# Patient Record
Sex: Female | Born: 1950 | Race: White | Hispanic: No | Marital: Married | Smoking: Never smoker
Health system: Southern US, Community
[De-identification: ages and names within clinical notes are randomized; demographics above are authoritative.]

## PROBLEM LIST (undated history)

## (undated) DIAGNOSIS — M459 Ankylosing spondylitis of unspecified sites in spine: Secondary | ICD-10-CM

## (undated) DIAGNOSIS — G8929 Other chronic pain: Secondary | ICD-10-CM

## (undated) DIAGNOSIS — M797 Fibromyalgia: Secondary | ICD-10-CM

## (undated) DIAGNOSIS — E039 Hypothyroidism, unspecified: Secondary | ICD-10-CM

## (undated) HISTORY — PX: BREAST EXCISIONAL BIOPSY: SUR124

---

## 2020-01-26 ENCOUNTER — Other Ambulatory Visit: Payer: Self-pay | Admitting: Internal Medicine

## 2020-01-26 DIAGNOSIS — Z1231 Encounter for screening mammogram for malignant neoplasm of breast: Secondary | ICD-10-CM

## 2020-02-16 ENCOUNTER — Other Ambulatory Visit: Payer: Self-pay

## 2020-02-16 ENCOUNTER — Ambulatory Visit
Admission: RE | Admit: 2020-02-16 | Discharge: 2020-02-16 | Disposition: A | Discharge status: Home / Self Care | Payer: Medicare Other | Source: Ambulatory Visit | Attending: Internal Medicine | Admitting: Internal Medicine

## 2020-02-16 DIAGNOSIS — Z1231 Encounter for screening mammogram for malignant neoplasm of breast: Secondary | ICD-10-CM | POA: Diagnosis not present

## 2020-10-28 DIAGNOSIS — U071 COVID-19: Secondary | ICD-10-CM

## 2020-10-28 HISTORY — DX: COVID-19: U07.1

## 2020-11-13 ENCOUNTER — Encounter: Payer: Self-pay | Admitting: Emergency Medicine

## 2020-11-13 ENCOUNTER — Other Ambulatory Visit: Payer: Self-pay

## 2020-11-13 ENCOUNTER — Emergency Department
Admission: EM | Admit: 2020-11-13 | Discharge: 2020-11-13 | Disposition: A | Discharge status: Home / Self Care | Payer: Medicare Other | Attending: Emergency Medicine | Admitting: Emergency Medicine

## 2020-11-13 ENCOUNTER — Emergency Department: Payer: Medicare Other

## 2020-11-13 DIAGNOSIS — R031 Nonspecific low blood-pressure reading: Secondary | ICD-10-CM | POA: Diagnosis present

## 2020-11-13 DIAGNOSIS — E039 Hypothyroidism, unspecified: Secondary | ICD-10-CM | POA: Diagnosis not present

## 2020-11-13 DIAGNOSIS — R55 Syncope and collapse: Secondary | ICD-10-CM | POA: Diagnosis not present

## 2020-11-13 DIAGNOSIS — Z8616 Personal history of COVID-19: Secondary | ICD-10-CM | POA: Insufficient documentation

## 2020-11-13 HISTORY — DX: Fibromyalgia: M79.7

## 2020-11-13 HISTORY — DX: Other chronic pain: G89.29

## 2020-11-13 HISTORY — DX: Hypothyroidism, unspecified: E03.9

## 2020-11-13 LAB — TROPONIN I (HIGH SENSITIVITY)
Troponin I (High Sensitivity): 4 ng/L (ref <18)
Troponin I (High Sensitivity): 4 ng/L (ref <18)

## 2020-11-13 LAB — CBC WITH DIFFERENTIAL/PLATELET
Abs Immature Granulocytes: 0.03 10*3/uL (ref 0.00–0.07)
Basophils Absolute: 0.1 10*3/uL (ref 0.0–0.1)
Basophils Relative: 1 %
Eosinophils Absolute: 0.6 10*3/uL — ABNORMAL HIGH (ref 0.0–0.5)
Eosinophils Relative: 10 %
HCT: 31.8 % — ABNORMAL LOW (ref 36.0–46.0)
Hemoglobin: 10.4 g/dL — ABNORMAL LOW (ref 12.0–15.0)
Immature Granulocytes: 1 %
Lymphocytes Relative: 24 %
Lymphs Abs: 1.4 10*3/uL (ref 0.7–4.0)
MCH: 27.4 pg (ref 26.0–34.0)
MCHC: 32.7 g/dL (ref 30.0–36.0)
MCV: 83.7 fL (ref 80.0–100.0)
Monocytes Absolute: 0.6 10*3/uL (ref 0.1–1.0)
Monocytes Relative: 10 %
Neutro Abs: 3.2 10*3/uL (ref 1.7–7.7)
Neutrophils Relative %: 54 %
Platelets: 227 10*3/uL (ref 150–400)
RBC: 3.8 MIL/uL — ABNORMAL LOW (ref 3.87–5.11)
RDW: 13.8 % (ref 11.5–15.5)
WBC: 5.8 10*3/uL (ref 4.0–10.5)
nRBC: 0 % (ref 0.0–0.2)

## 2020-11-13 LAB — PROTIME-INR
INR: 1 (ref 0.8–1.2)
Prothrombin Time: 12.6 seconds (ref 11.4–15.2)

## 2020-11-13 LAB — COMPREHENSIVE METABOLIC PANEL
ALT: 14 U/L (ref 0–44)
AST: 26 U/L (ref 15–41)
Albumin: 3.6 g/dL (ref 3.5–5.0)
Alkaline Phosphatase: 44 U/L (ref 38–126)
Anion gap: 10 (ref 5–15)
BUN: 25 mg/dL — ABNORMAL HIGH (ref 8–23)
CO2: 24 mmol/L (ref 22–32)
Calcium: 8.8 mg/dL — ABNORMAL LOW (ref 8.9–10.3)
Chloride: 101 mmol/L (ref 98–111)
Creatinine, Ser: 0.94 mg/dL (ref 0.44–1.00)
GFR, Estimated: >60 mL/min (ref >60)
Glucose, Bld: 135 mg/dL — ABNORMAL HIGH (ref 70–99)
Potassium: 3.3 mmol/L — ABNORMAL LOW (ref 3.5–5.1)
Sodium: 135 mmol/L (ref 135–145)
Total Bilirubin: 0.7 mg/dL (ref 0.3–1.2)
Total Protein: 6.6 g/dL (ref 6.5–8.1)

## 2020-11-13 LAB — URINALYSIS, COMPLETE (UACMP) WITH MICROSCOPIC
Bilirubin Urine: NEGATIVE
Glucose, UA: NEGATIVE mg/dL
Hgb urine dipstick: NEGATIVE
Ketones, ur: NEGATIVE mg/dL
Nitrite: NEGATIVE
Protein, ur: NEGATIVE mg/dL
Specific Gravity, Urine: 1.01 (ref 1.005–1.030)
pH: 9 — ABNORMAL HIGH (ref 5.0–8.0)

## 2020-11-13 LAB — T4, FREE: Free T4: 1 ng/dL (ref 0.61–1.12)

## 2020-11-13 LAB — PROCALCITONIN: Procalcitonin: <0.1 ng/mL

## 2020-11-13 LAB — LIPASE, BLOOD: Lipase: 39 U/L (ref 11–51)

## 2020-11-13 LAB — LACTIC ACID, PLASMA: Lactic Acid, Venous: 1.5 mmol/L (ref 0.5–1.9)

## 2020-11-13 LAB — TSH: TSH: 2.258 u[IU]/mL (ref 0.350–4.500)

## 2020-11-13 MED ORDER — POTASSIUM CHLORIDE CRYS ER 20 MEQ PO TBCR
40.0000 meq | EXTENDED_RELEASE_TABLET | Freq: Once | ORAL | Status: AC
  Administered 2020-11-13: 40 meq via ORAL
  Filled 2020-11-13: qty 2

## 2020-11-13 MED ORDER — LACTATED RINGERS IV BOLUS (SEPSIS)
1000.0000 mL | Freq: Once | INTRAVENOUS | Status: AC
  Administered 2020-11-13: 1000 mL via INTRAVENOUS

## 2020-11-13 NOTE — ED Notes (Signed)
Pt ambulated with assistance to toilet. Then ambulated back to bed.

## 2020-11-13 NOTE — ED Triage Notes (Signed)
Pt brought in by [facility] from [place], pt states she got up felt weak and fell onto floor, co pain to buttocks. Staff states bp was 60 systolic, ems states was 80 systolic when they arrived. EMS states after IVF bp is now 91/56. Pt states she just became dizzy and sat onto floor

## 2020-11-13 NOTE — Discharge Instructions (Signed)
You have been seen today in the Emergency Department (ED)  for syncope (passing out).  Your workup including labs and EKG show reassuring results.  Your symptoms may be due to dehydration, so it is important that you drink plenty of non-alcoholic fluids.  We talked about bringing him into the hospital for additional evaluation, but you would prefer to go home, and we agree that it should be safe to follow-up as an outpatient.  You can follow-up with your primary care doctor but we encourage you to call the cardiologist at the number listed to schedule the next available appointment, likely either for today or tomorrow.  Dr. Welton Flakes will discuss with you other options for outpatient work-up of your syncope.  Return to the Emergency Department (ED)  if you have any further syncopal episodes (pass out again) or develop ANY chest pain, pressure, tightness, trouble breathing, sudden sweating, or other symptoms that concern you.

## 2020-11-13 NOTE — ED Provider Notes (Signed)
[facility] Emergency Department Provider Note  ____________________________________________   Event Date/Time   First MD Initiated Contact with Patient 11/13/20 [phone]     (approximate)  I have reviewed the triage vital signs and the nursing notes.   HISTORY  Chief Complaint Hypotension    HPI Evelyn Christian is a 70 y.o. female with no reported chronic medical issues who presents for evaluation after a fall and for low blood pressure.  She said that she has essentially been in her usual state of health even though she is recovering from a mild COVID-19 infection about 2 weeks ago.  She said that she got up from bed and felt very heavy.  She got some milk but thinks that she must of lost consciousness because she collapsed on the floor causing some pain in her left buttock and she spilled her milk.  She has no pain in her head or her neck at this time.  She does not have any specific complaints except for some pain in left buttock presumably from the fall.  She denies chest pain, shortness of breath, nausea, vomiting, and abdominal pain  EMS reports that her initial blood pressure was about 63/33.  It improved to about 90 systolic after 638 mL normal saline which she received in route to the hospital.  She said that she feels like her mouth is dry but otherwise she feels okay currently.  The onset was acute and potentially severe and nothing in particular made it better or worse other than her blood pressure improving with fluids.         Past Medical History:  Diagnosis Date  . Chronic pain   . COVID-19 10/28/2020   date is approximate  . Fibromyalgia   . Hypothyroidism     There are no problems to display for this patient.   Past Surgical History:  Procedure Laterality Date  . BREAST EXCISIONAL BIOPSY Left 1980's    Prior to Admission medications   Not on File    Allergies Sulfa antibiotics and Latex  Family History  Problem Relation Age  of Onset  . BRCA 1/2 Neg Hx     Social History Social History   Tobacco Use  . Smoking status: Never Smoker  . Smokeless tobacco: Never Used    Review of Systems Constitutional: No fever/chills Eyes: No visual changes. ENT: No sore throat. Cardiovascular: Possible syncopal episode.  Feeling heavy and lightheaded. Respiratory: Denies shortness of breath. Gastrointestinal: No abdominal pain.  No nausea, no vomiting.  No diarrhea.  No constipation. Genitourinary: Negative for dysuria. Musculoskeletal: Negative for neck pain.  Negative for back pain. Integumentary: Negative for rash. Neurological: Negative for headaches, focal weakness or numbness.   ____________________________________________   PHYSICAL EXAM:  VITAL SIGNS: ED Triage Vitals  Enc Vitals Group     BP 11/13/20 0453 107/62     Pulse Rate 11/13/20 0453 (!) 57     Resp 11/13/20 0453 20     Temp 11/13/20 0453 (!) 97.5 F (36.4 C)     Temp Source 11/13/20 0453 Oral     SpO2 11/13/20 0453 100 %     Weight 11/13/20 0454 65.8 kg (145 lb)     Height 11/13/20 0454 1.524 m (5')     Head Circumference --      Peak Flow --      Pain Score 11/13/20 0454 5     Pain Loc --      Pain Edu? --  Excl. in [place]? --     Constitutional: Alert and oriented.  Eyes: Conjunctivae are normal.  Head: Atraumatic. Nose: No congestion/rhinnorhea. Mouth/Throat: Patient is wearing a mask. Neck: No stridor.  No meningeal signs.   Cardiovascular: Normal rate, regular rhythm. Good peripheral circulation. Respiratory: Normal respiratory effort.  No retractions. Gastrointestinal: Soft and nontender. No distention.  Musculoskeletal: No lower extremity tenderness nor edema. No gross deformities of extremities. Neurologic:  Normal speech and language. No gross focal neurologic deficits are appreciated.  Skin:  Skin is warm, dry and intact. Psychiatric: Mood and affect are normal. Speech and behavior are  normal.  ____________________________________________   LABS (all labs ordered are listed, but only abnormal results are displayed)  Labs Reviewed  COMPREHENSIVE METABOLIC PANEL - Abnormal; Notable for the following components:      Result Value   Potassium 3.3 (*)    Glucose, Bld 135 (*)    BUN 25 (*)    Calcium 8.8 (*)    All other components within normal limits  CBC WITH DIFFERENTIAL/PLATELET - Abnormal; Notable for the following components:   RBC 3.80 (*)    Hemoglobin 10.4 (*)    HCT 31.8 (*)    Eosinophils Absolute 0.6 (*)    All other components within normal limits  URINALYSIS, COMPLETE (UACMP) WITH MICROSCOPIC - Abnormal; Notable for the following components:   Color, Urine YELLOW (*)    APPearance HAZY (*)    pH 9.0 (*)    Leukocytes,Ua SMALL (*)    Bacteria, UA RARE (*)    All other components within normal limits  CULTURE, BLOOD (ROUTINE X 2)  URINE CULTURE  CULTURE, BLOOD (ROUTINE X 2)  LACTIC ACID, PLASMA  PROTIME-INR  LIPASE, BLOOD  PROCALCITONIN  T4, FREE  TSH  TROPONIN I (HIGH SENSITIVITY)  TROPONIN I (HIGH SENSITIVITY)   ____________________________________________  EKG  ED ECG REPORT I, Hinda Kehr, the attending physician, personally viewed and interpreted this ECG.  Date: 11/13/2020 EKG Time: 4:57 AM Rate: 60 Rhythm: normal sinus rhythm QRS Axis: normal Intervals: RSR' pattern ST/T Wave abnormalities: Non-specific ST segment / T-wave changes, but no clear evidence of acute ischemia. Narrative Interpretation: no definitive evidence of acute ischemia; does not meet STEMI criteria.   ____________________________________________  RADIOLOGY I, Hinda Kehr, personally viewed and evaluated these images (plain radiographs) as part of my medical decision making, as well as reviewing the written report by the radiologist.  ED MD interpretation: No acute abnormality identified on chest x-ray.  Official radiology report(s): DG Chest Port 1  View  Result Date: 11/13/2020 CLINICAL DATA:  Questionable sepsis. EXAM: PORTABLE CHEST 1 VIEW COMPARISON:  No prior. FINDINGS: Mediastinum hilar structures normal. Cardiomegaly. No focal infiltrate. No pleural effusion or pneumothorax. IMPRESSION: 1.  Cardiomegaly.  No pulmonary venous congestion. 2.  No acute infiltrate. Electronically Signed   By: Marcello Moores  Register   On: 11/13/2020 05:21    ____________________________________________   PROCEDURES   Procedure(s) performed (including Critical Care):  .1-3 Lead EKG Interpretation Performed by: Hinda Kehr, MD Authorized by: Hinda Kehr, MD     Interpretation: normal     ECG rate:  60   ECG rate assessment: normal     Rhythm: sinus rhythm     Ectopy: none     Conduction: normal       ____________________________________________   INITIAL IMPRESSION / MDM / ASSESSMENT AND PLAN / ED COURSE  As part of my medical decision making, I reviewed the following data within the electronic  MEDICAL RECORD NUMBER Nursing notes reviewed and incorporated, Labs reviewed , EKG interpreted , Old chart reviewed, Radiograph reviewed , Notes from prior ED visits and [place] Controlled Substance Database   Differential diagnosis includes, but is not limited to, vasovagal episode, cardiogenic syncope, metabolic or electrolyte abnormality, sepsis, other nonspecific shock.  Less likely CVA or ACS.  The patient is on the cardiac monitor to evaluate for evidence of arrhythmia and/or significant heart rate changes.  EKG unremarkable.  Vital signs are stable; blood pressure is improved so that it is still relatively low but it is much improved over the initial measurements by EMS.  Patient is not exhibiting infectious signs or symptoms.  I am initiating a "possible sepsis" work-up as well as checking thyroid function and I ordered 1 L lactated Ringer's.  I will reassess.  Patient understands and agrees with the plan.  No indication for emergent imaging at this  time other than a chest x-ray as part of the "possible sepsis" work-up.     Clinical Course as of 11/13/20 0836  Tue Nov 13, 2020  0554 DG Chest [facility] I personally reviewed the patient's imaging and agree with the radiologist's interpretation that there is no acute abnormality on chest x-ray. [CF]  0622 Procalcitonin: <0.10 [CF]  0622 Lactic Acid, Venous: 1.5 [CF]  0622 CBC WITH DIFFERENTIAL(!) Reassuring CBC with no significant abnormalities [CF]  0828 Repeat high-sensitivity troponin is normal.  I had a conversation with the patient and her husband.  She is asymptomatic.  Her blood pressures been in the 144R systolic.  We had a discussion about syncope, the implications of her blood pressure, etc.  I offered to admit her but told her that I thought that she would likely be safe to follow-up as an outpatient and she would prefer this option.  She has not seen cardiology in the past so I am giving her follow-up information with Dr. Humphrey Rolls.  I gave her and her husband strict return precautions should she develop new or worsening symptoms or should she have additional syncopal episodes or other symptoms that concern her.  They understand and agree with the plan.  Again overall her results are very reassuring.  She has a slightly decreased potassium at 3.3 and I will replete with 40 mill equivalents by mouth before discharge.  CBC is normal.  No evidence of urinary tract infection.  Procalcitonin is negative.  Thyroid function tests are within normal limits.  She has been stable on the cardiac monitor for over 3 and half hours. [CF]    Clinical Course User Index [CF] Hinda Kehr, MD     ____________________________________________  FINAL CLINICAL IMPRESSION(S) / ED DIAGNOSES  Final diagnoses:  Syncope and collapse     MEDICATIONS GIVEN DURING THIS VISIT:  Medications  potassium chloride SA (KLOR-CON) CR tablet 40 mEq (has no administration in time range)  lactated ringers bolus  1,000 mL (0 mLs Intravenous Stopped 11/13/20 0607)     ED Discharge Orders    None       Note:  This document was prepared using Dragon voice recognition software and may include unintentional dictation errors.   Hinda Kehr, MD 11/13/20 [phone]

## 2020-11-14 LAB — URINE CULTURE: Culture: NO GROWTH

## 2020-11-18 LAB — CULTURE, BLOOD (ROUTINE X 2)
Culture: NO GROWTH
Special Requests: ADEQUATE

## 2021-01-14 ENCOUNTER — Other Ambulatory Visit: Payer: Self-pay | Admitting: Internal Medicine

## 2021-01-14 DIAGNOSIS — Z1231 Encounter for screening mammogram for malignant neoplasm of breast: Secondary | ICD-10-CM

## 2021-03-28 ENCOUNTER — Other Ambulatory Visit: Payer: Self-pay

## 2021-03-28 ENCOUNTER — Ambulatory Visit
Admission: RE | Admit: 2021-03-28 | Discharge: 2021-03-28 | Disposition: A | Discharge status: Home / Self Care | Payer: Medicare Other | Source: Ambulatory Visit | Attending: Internal Medicine | Admitting: Internal Medicine

## 2021-03-28 DIAGNOSIS — Z1231 Encounter for screening mammogram for malignant neoplasm of breast: Secondary | ICD-10-CM | POA: Insufficient documentation

## 2021-07-05 ENCOUNTER — Other Ambulatory Visit
Admission: RE | Admit: 2021-07-05 | Discharge: 2021-07-05 | Disposition: A | Discharge status: Home / Self Care | Payer: Medicare Other | Attending: Ophthalmology | Admitting: Ophthalmology

## 2021-07-05 DIAGNOSIS — G4452 New daily persistent headache (NDPH): Secondary | ICD-10-CM | POA: Diagnosis present

## 2021-07-05 LAB — C-REACTIVE PROTEIN: CRP: 0.7 mg/dL (ref <1.0)

## 2021-12-26 IMAGING — DX DG CHEST 1V PORT
1 series · 1 of 1 positions shown · non-contrast
Comparison: No prior.

CLINICAL DATA: Questionable sepsis.

EXAM:
PORTABLE CHEST 1 VIEW

[chest ap]
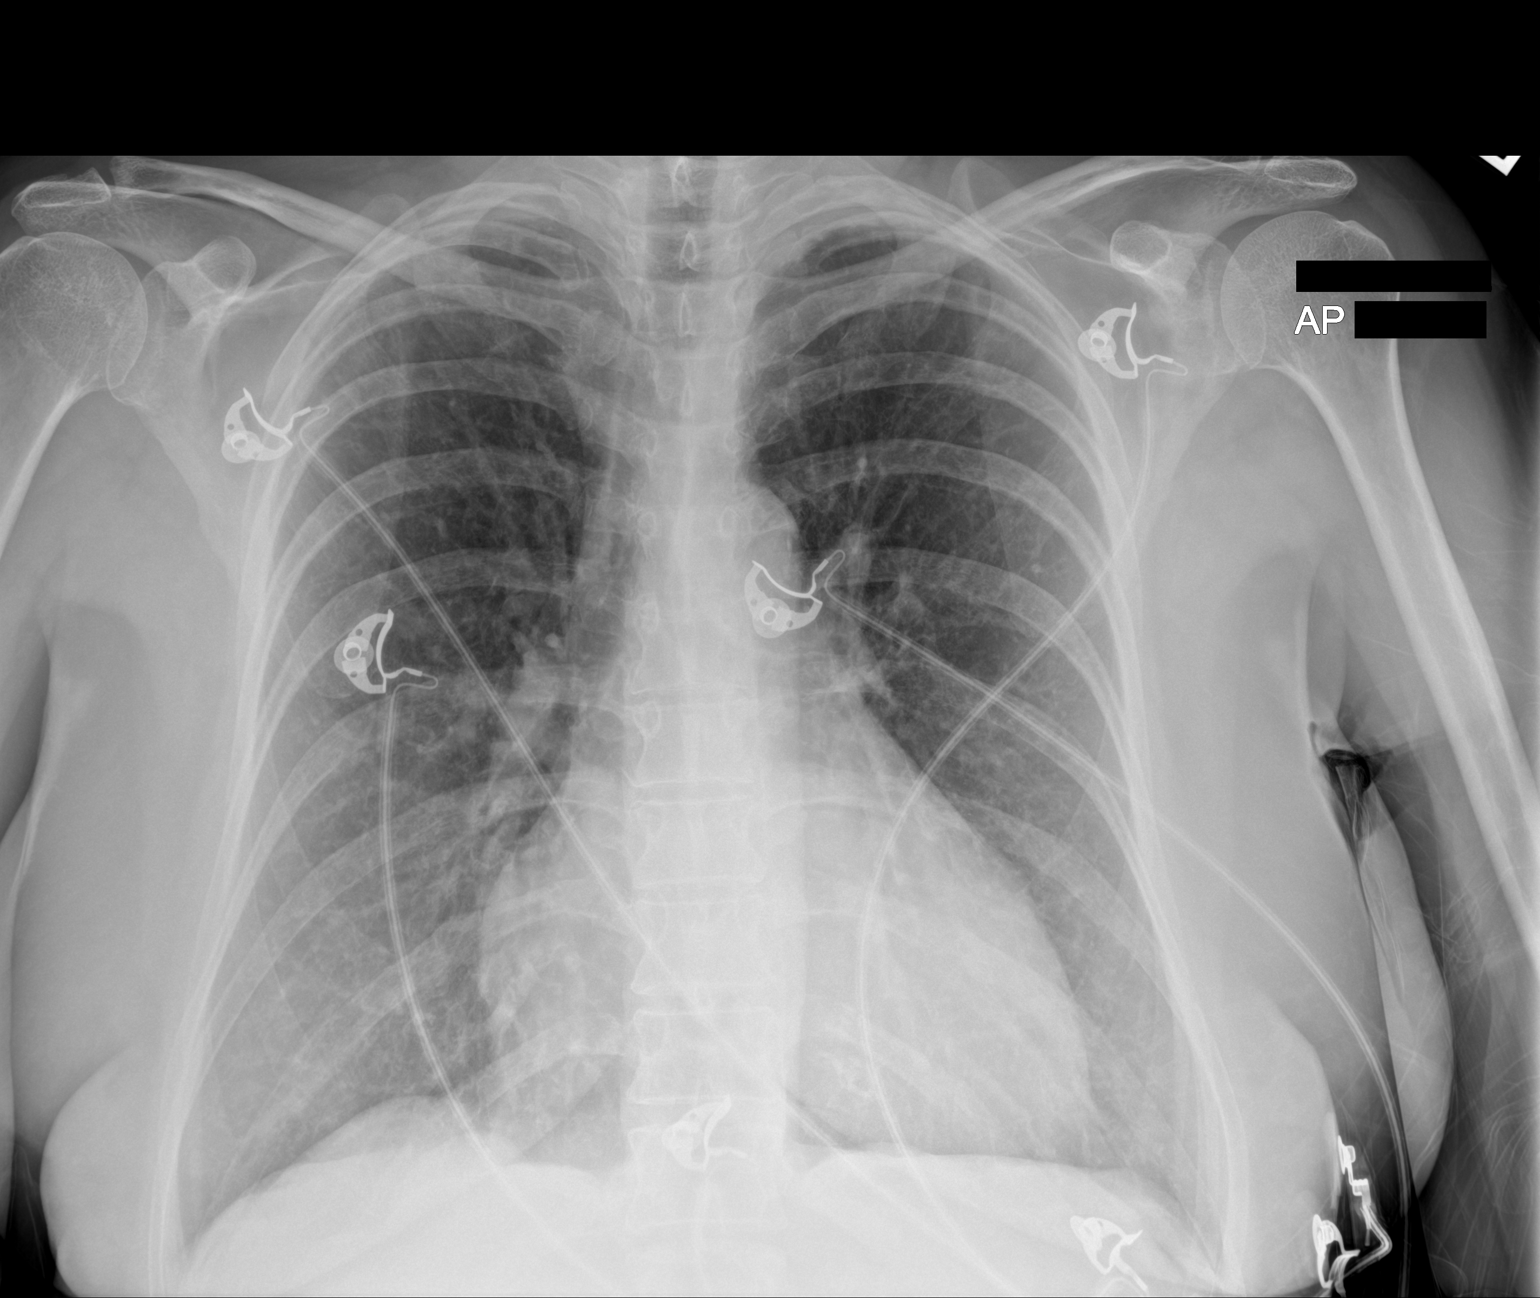

[1 of 1 positions shown; findings below may reference images not displayed]

FINDINGS: Mediastinum hilar structures normal. Cardiomegaly. No focal
infiltrate. No pleural effusion or pneumothorax.
IMPRESSION: 1.  Cardiomegaly.  No pulmonary venous congestion.

2.  No acute infiltrate.

## 2021-12-28 ENCOUNTER — Other Ambulatory Visit: Payer: Self-pay

## 2021-12-28 ENCOUNTER — Encounter: Payer: Self-pay | Admitting: *Deleted

## 2021-12-28 ENCOUNTER — Emergency Department: Payer: Medicare Other

## 2021-12-28 ENCOUNTER — Emergency Department
Admission: EM | Admit: 2021-12-28 | Discharge: 2021-12-28 | Disposition: A | Discharge status: Home / Self Care | Payer: Medicare Other | Attending: Emergency Medicine | Admitting: Emergency Medicine

## 2021-12-28 DIAGNOSIS — S60222A Contusion of left hand, initial encounter: Secondary | ICD-10-CM | POA: Diagnosis not present

## 2021-12-28 DIAGNOSIS — S60229A Contusion of unspecified hand, initial encounter: Secondary | ICD-10-CM

## 2021-12-28 DIAGNOSIS — W010XXA Fall on same level from slipping, tripping and stumbling without subsequent striking against object, initial encounter: Secondary | ICD-10-CM | POA: Insufficient documentation

## 2021-12-28 DIAGNOSIS — Y9373 Activity, racquet and hand sports: Secondary | ICD-10-CM | POA: Insufficient documentation

## 2021-12-28 DIAGNOSIS — S6992XA Unspecified injury of left wrist, hand and finger(s), initial encounter: Secondary | ICD-10-CM | POA: Diagnosis present

## 2021-12-28 DIAGNOSIS — S80212A Abrasion, left knee, initial encounter: Secondary | ICD-10-CM | POA: Insufficient documentation

## 2021-12-28 HISTORY — DX: Ankylosing spondylitis of unspecified sites in spine: M45.9

## 2021-12-28 MED ORDER — KETOROLAC TROMETHAMINE 30 MG/ML IJ SOLN
15.0000 mg | Freq: Once | INTRAMUSCULAR | Status: AC
  Administered 2021-12-28: 15 mg via INTRAMUSCULAR
  Filled 2021-12-28: qty 1

## 2021-12-28 MED ORDER — HYDROCODONE-ACETAMINOPHEN 5-325 MG PO TABS: 1.0000 | ORAL_TABLET | Freq: Once | ORAL | Status: DC

## 2021-12-28 MED ORDER — KETOROLAC TROMETHAMINE 10 MG PO TABS: 10.0000 mg | ORAL_TABLET | Freq: Four times a day (QID) | ORAL | 0 refills | Status: AC | PRN

## 2021-12-28 MED ORDER — HYDROCODONE-ACETAMINOPHEN 5-325 MG PO TABS: 1.0000 | ORAL_TABLET | ORAL | 0 refills | Status: AC | PRN

## 2021-12-28 NOTE — ED Provider Notes (Signed)
[facility] Provider Note  Patient Contact: 10:19 PM (approximate)   History   Fall   HPI  Evelyn Christian is a 71 y.o. female who presents the emergency department complaining of left wrist/hand pain as well as left knee pain after falling while playing pickle ball.  Patient fell onto the left side, is having pain to the left wrist, hand as well as an abrasion to the left knee.  Did not hit her head or lose consciousness.  Denies any other injury or complaint to include back pain, hip pain.  No medications prior to arrival.     Physical Exam   Triage Vital Signs: ED Triage Vitals  Enc Vitals Group     BP 12/28/21 2159 139/65     Pulse Rate 12/28/21 2159 (!) 58     Resp 12/28/21 2159 16     Temp 12/28/21 2159 (!) 97.5 F (36.4 C)     Temp Source 12/28/21 2159 Oral     SpO2 12/28/21 2156 100 %     Weight 12/28/21 2202 140 lb (63.5 kg)     Height 12/28/21 2202 5' (1.524 m)     Head Circumference --      Peak Flow --      Pain Score 12/28/21 2200 8     Pain Loc --      Pain Edu? --      Excl. in [place]? --     Most recent vital signs: Vitals:   12/28/21 2230 12/28/21 2300  BP: (!) 144/68 (!) 146/69  Pulse: 60 (!) 59  Resp:    Temp:    SpO2: 99% 100%     General: Alert and in no acute distress.  Cardiovascular:  Good peripheral perfusion Respiratory: Normal respiratory effort without tachypnea or retractions. Lungs CTAB.  Musculoskeletal: Full range of motion to all extremities.  Visualization of the left wrist, left knee revealed abrasion to the left knee no obvious deformity to the left wrist.  Palpation reveals some tenderness along the dorsal aspect of the wrist extending into the scaphoid region.  No palpable abnormality.  Good range of motion all digits.  Sensation capillary refill intact all digits.  Examination of the left knee reveals no edema, ecchymosis, deformity or decreased range of motion.  Patient has minimal tenderness along the  anterior aspect of the knee.  No frank laceration but does have a superficial abrasion.  No evidence of retained foreign body.  No active bleeding.  Dorsalis pedis pulses sensation intact distally. Neurologic:  No gross focal neurologic deficits are appreciated.  Skin:   No rash noted Other:   ED Results / Procedures / Treatments   Labs (all labs ordered are listed, but only abnormal results are displayed) Labs Reviewed - No data to display   EKG     RADIOLOGY  I personally viewed, evaluated, and interpreted these images as part of my medical decision making, as well as reviewing the written report by the radiologist.  ED Provider Interpretation: No acute traumatic findings to the left hand, wrist or left knee.  Osteoarthritis identified.  DG Hand Complete Left  Result Date: 12/28/2021 CLINICAL DATA:  Post fall with left hand and wrist pain. EXAM: LEFT HAND - COMPLETE 3+ VIEW COMPARISON:  None Available. FINDINGS: There is no evidence of fracture or dislocation. Advanced arthropathy of the thumb carpal metacarpal joint. No erosions or bone destruction. Soft tissues are unremarkable. IMPRESSION: 1. No acute fracture or subluxation of the  left hand. 2. 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 Electronically Signed   By: Narda Rutherford M.D.   On: 12/28/2021 23:02   DG Knee Complete 4 Views Left  Result Date: 12/28/2021 CLINICAL DATA:  Post fall with left knee pain. EXAM: LEFT KNEE - COMPLETE 4+ VIEW COMPARISON:  None Available. FINDINGS: No evidence of fracture, dislocation, or joint effusion. Normal alignment and joint spaces. No evidence of arthropathy or other focal bone abnormality. Soft tissues are unremarkable. IMPRESSION: Negative radiographs of the left knee. Electronically Signed   By: Narda Rutherford M.D.   On: 12/28/2021 23:01   DG Wrist Complete Left  Result Date: 12/28/2021 CLINICAL DATA:  Post fall with left wrist and hand pain. EXAM: LEFT WRIST -  COMPLETE 3+ VIEW COMPARISON:  None Available. FINDINGS: There is no evidence of fracture or dislocation. 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 Mild scattered degenerative carpal bone cysts. Well corticated density distal to the ulna styloid may represent sequela of remote injury or an accessory ossicle, chronic. Soft tissues are unremarkable. IMPRESSION: 1. No acute fracture or subluxation of the left wrist. 2. 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 Electronically Signed   By: Narda Rutherford M.D.   On: 12/28/2021 23:00    PROCEDURES:  Critical Care performed: No  Procedures   MEDICATIONS ORDERED IN ED: Medications  ketorolac (TORADOL) 30 MG/ML injection 15 mg (15 mg Intramuscular Given 12/28/21 2305)     IMPRESSION / MDM / ASSESSMENT AND PLAN / ED COURSE  I reviewed the triage vital signs and the nursing notes.                              Differential diagnosis includes, but is not limited to, contusion, fracture, ligamentous injury  Patient's presentation is most consistent with acute presentation with potential threat to life or bodily function.   Patient's diagnosis is consistent with an contusion, knee abrasion.  Patient presents to the ED after a mechanical fall while playing pickle ball.  Patient fell onto her left hand and left knee.  Superficial abrasion to the left knee.  Patient's primary complaint was pain, ecchymosis to the left hand.  Imaging is reassuring with no acute osseous abnormality.  Patient still has extension and flexion of the hand no concern at this time for tendon injury.  Patient will be placed into a Velcro wrist brace.  Toradol, Norco prescribed for the patient.  Follow-up with orthopedics if symptoms or not improving..  Patient is given ED precautions to return to the ED for any worsening or new symptoms.    Clinical Course as of 12/28/21 2325  Sat Dec 28, 2021  2257 DG Wrist Complete Left [JW]    Clinical  Course User Index [JW] Orvil Feil, [place]     FINAL CLINICAL IMPRESSION(S) / ED DIAGNOSES   Final diagnoses:  Contusion of dorsum of hand  Abrasion of left knee, initial encounter     Rx / DC Orders   ED Discharge Orders          Ordered    ketorolac (TORADOL) 10 MG tablet  Every 6 hours PRN        12/28/21 2324    HYDROcodone-acetaminophen (NORCO/VICODIN) 5-325 MG tablet  Every 4 hours PRN        12/28/21 2324             Note:  This document was prepared  using Conservation officer, historic buildings and may include unintentional dictation errors.   Lanette Hampshire 12/28/21 2326    Shaune Pollack, MD 01/05/22 1430

## 2021-12-28 NOTE — ED Triage Notes (Signed)
Per EMS report, patient was playing pickle ball and tripped over her feet. Patient c/o left wrist pain and left knee pain.

## 2022-02-18 ENCOUNTER — Other Ambulatory Visit: Payer: Self-pay | Admitting: Internal Medicine

## 2022-02-18 DIAGNOSIS — Z1231 Encounter for screening mammogram for malignant neoplasm of breast: Secondary | ICD-10-CM

## 2022-03-31 ENCOUNTER — Ambulatory Visit
Admission: RE | Admit: 2022-03-31 | Discharge: 2022-03-31 | Disposition: A | Discharge status: Home / Self Care | Payer: Medicare Other | Source: Ambulatory Visit | Attending: Internal Medicine | Admitting: Internal Medicine

## 2022-03-31 DIAGNOSIS — Z1231 Encounter for screening mammogram for malignant neoplasm of breast: Secondary | ICD-10-CM | POA: Diagnosis present

## 2022-05-10 IMAGING — MG MM DIGITAL SCREENING BILAT W/ TOMO AND CAD
8 series · 8 of 24 positions shown · non-contrast
Comparison: Previous exam(s).

CLINICAL DATA: Screening.

EXAM:
DIGITAL SCREENING BILATERAL MAMMOGRAM WITH TOMOSYNTHESIS AND CAD
TECHNIQUE: Bilateral screening digital craniocaudal and mediolateral oblique
mammograms were obtained. Bilateral screening digital breast
tomosynthesis was performed. The images were evaluated with
computer-aided detection.

[L CC synth-2D]
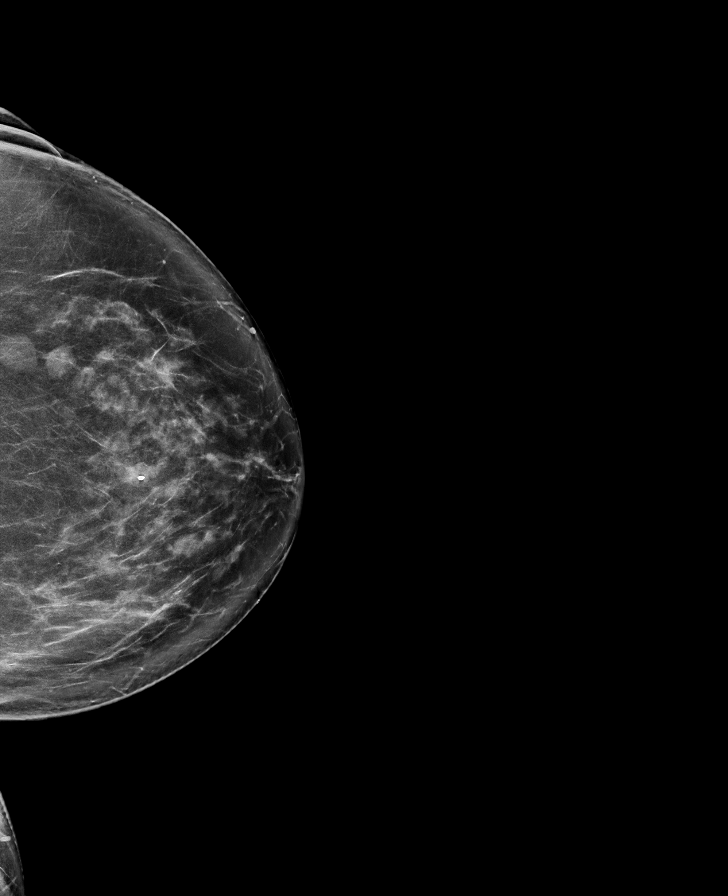

[R MLO synth-2D]
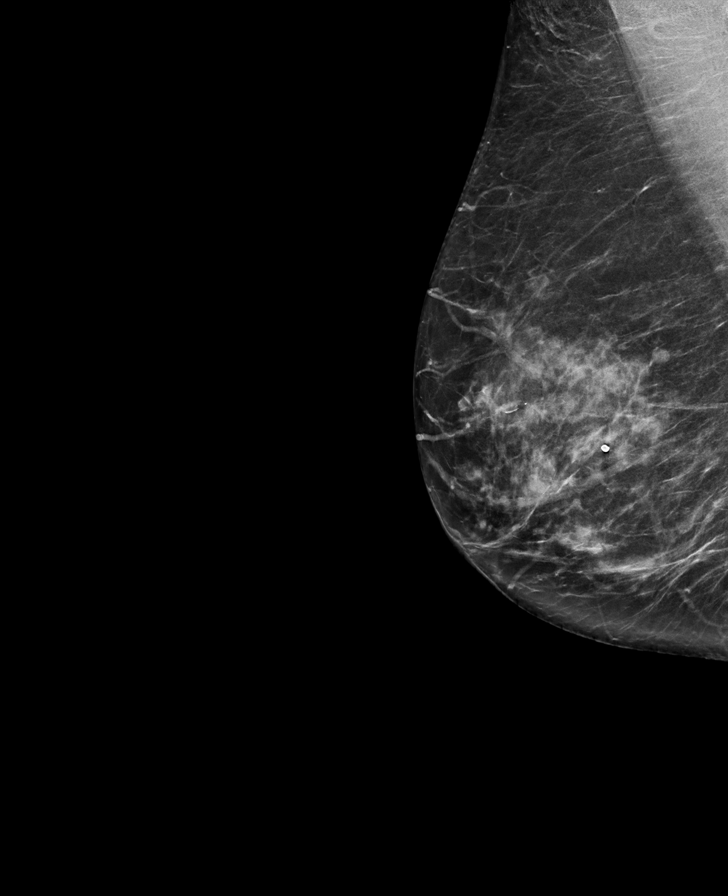

[R CC synth-2D]
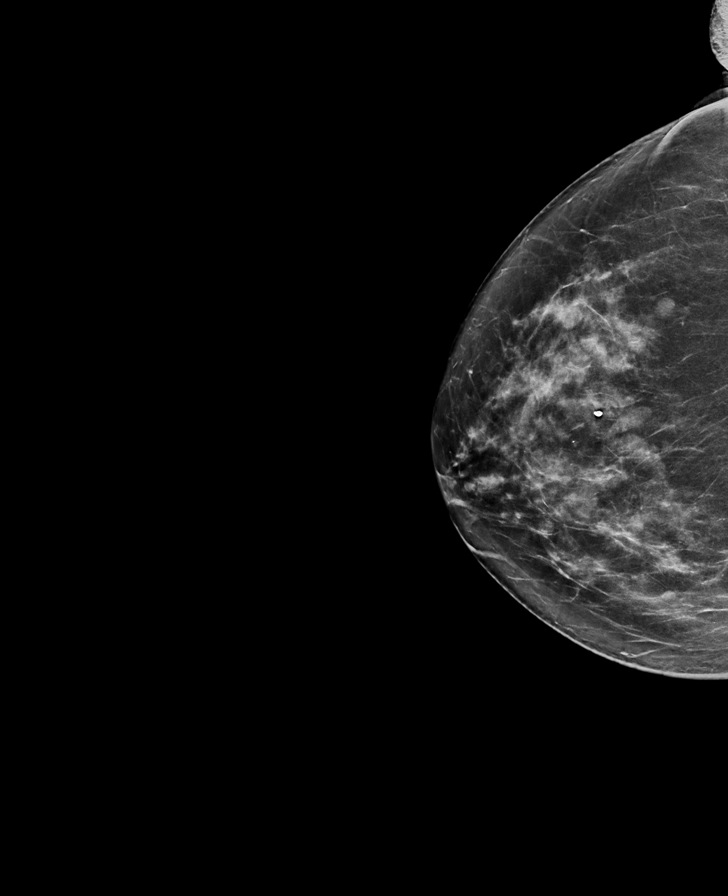

[L MLO synth-2D]
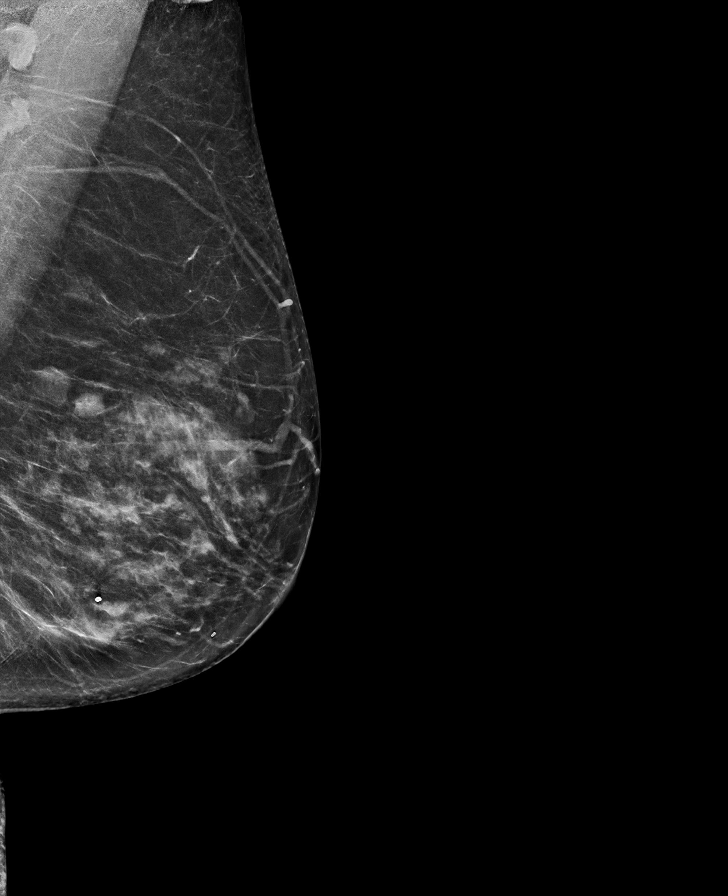

[L MLO tomo · tomo slice 37/72.0]
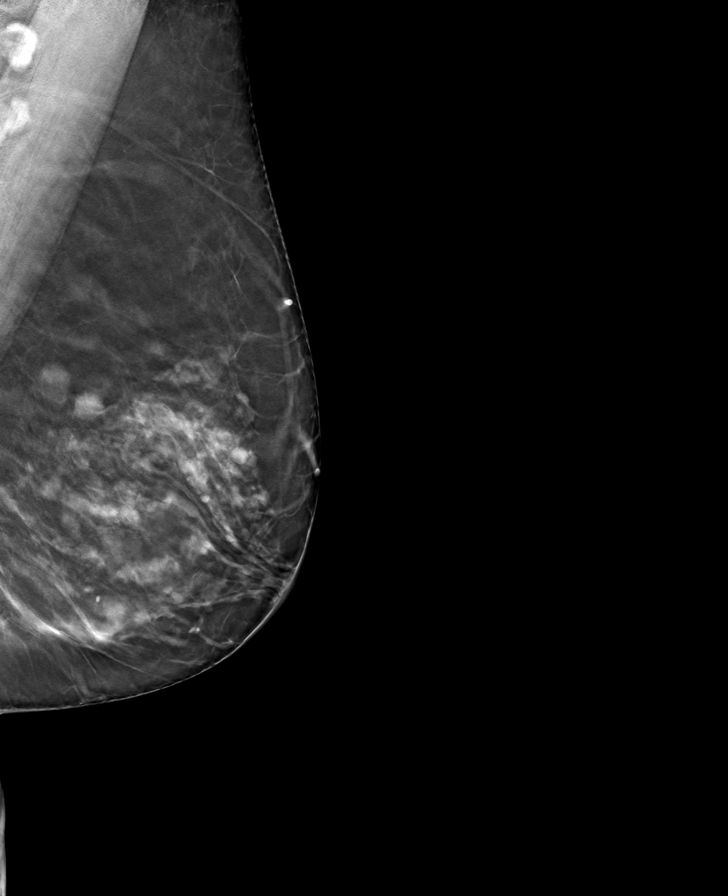

[L CC tomo · tomo slice 43/84.0]
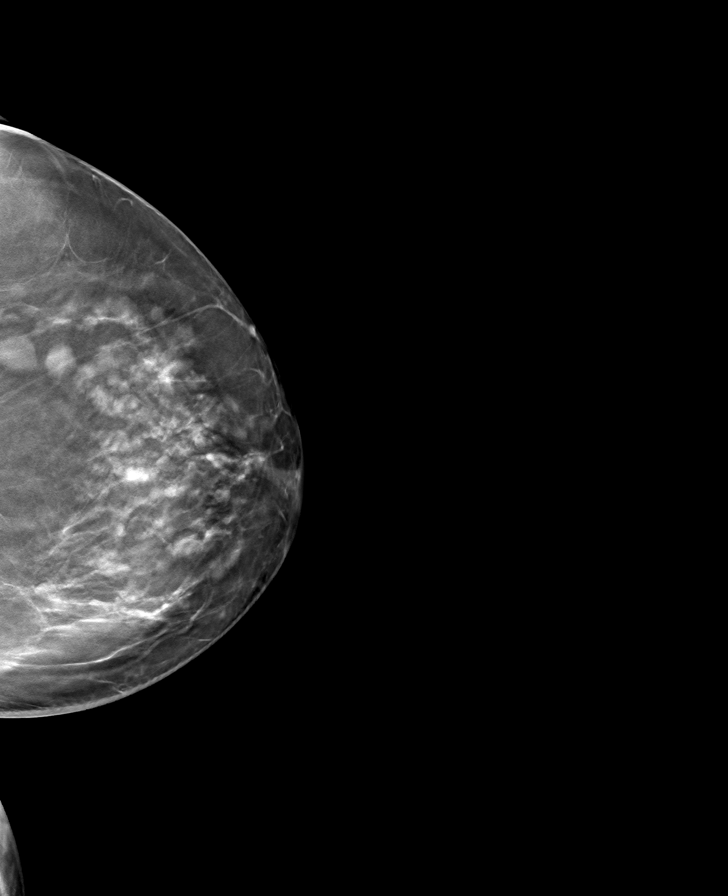

[R CC tomo · tomo slice 39/76.0]
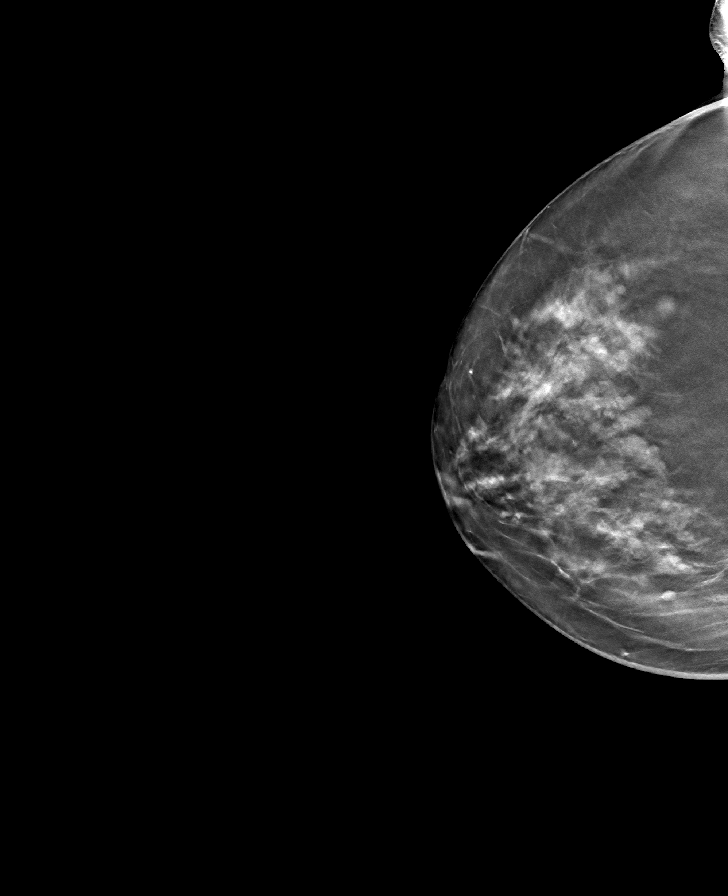

[R MLO tomo · tomo slice 37/72.0]
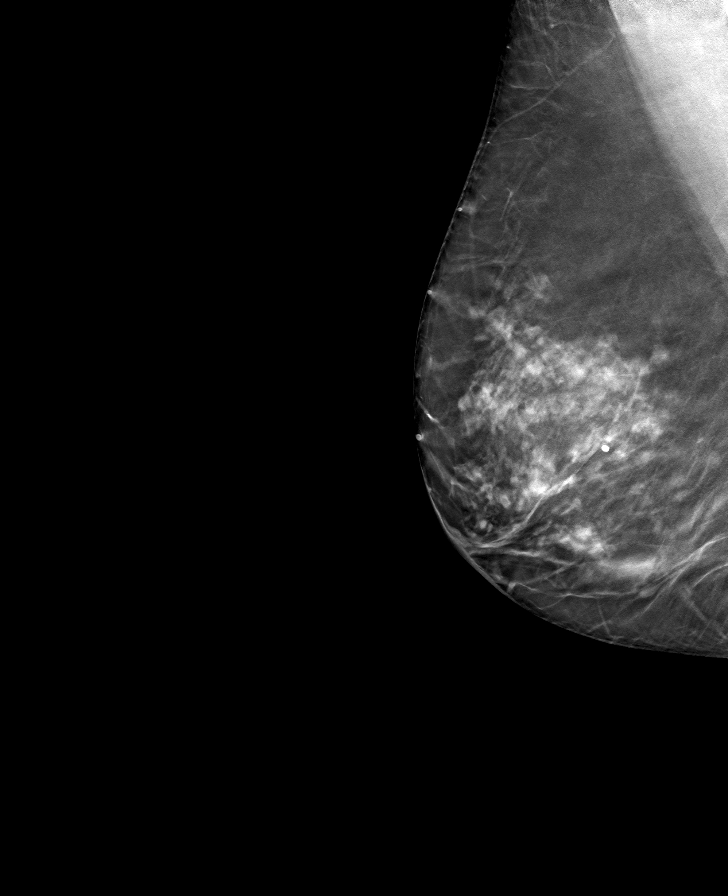

[8 of 24 positions shown; findings below may reference images not displayed]

ACR Breast Density Category c: The breast tissue is heterogeneously
dense, which may obscure small masses.
FINDINGS: There are no findings suspicious for malignancy.
IMPRESSION: No mammographic evidence of malignancy. A result letter of this
screening mammogram will be mailed directly to the patient.

RECOMMENDATION:
Screening mammogram in one year. (Code:Q3-W-BC3)

BI-RADS CATEGORY  1: Negative.

## 2023-01-18 ENCOUNTER — Emergency Department
Admission: EM | Admit: 2023-01-18 | Discharge: 2023-01-18 | Disposition: A | Discharge status: Home / Self Care | Payer: Medicare Other | Attending: Emergency Medicine | Admitting: Emergency Medicine

## 2023-01-18 ENCOUNTER — Emergency Department: Payer: Medicare Other

## 2023-01-18 DIAGNOSIS — W19XXXA Unspecified fall, initial encounter: Secondary | ICD-10-CM

## 2023-01-18 DIAGNOSIS — I9589 Other hypotension: Secondary | ICD-10-CM | POA: Insufficient documentation

## 2023-01-18 DIAGNOSIS — R531 Weakness: Secondary | ICD-10-CM | POA: Insufficient documentation

## 2023-01-18 DIAGNOSIS — W010XXA Fall on same level from slipping, tripping and stumbling without subsequent striking against object, initial encounter: Secondary | ICD-10-CM | POA: Diagnosis not present

## 2023-01-18 DIAGNOSIS — N390 Urinary tract infection, site not specified: Secondary | ICD-10-CM | POA: Insufficient documentation

## 2023-01-18 DIAGNOSIS — Z1152 Encounter for screening for COVID-19: Secondary | ICD-10-CM | POA: Insufficient documentation

## 2023-01-18 DIAGNOSIS — Z20822 Contact with and (suspected) exposure to covid-19: Secondary | ICD-10-CM | POA: Insufficient documentation

## 2023-01-18 DIAGNOSIS — R55 Syncope and collapse: Secondary | ICD-10-CM | POA: Diagnosis present

## 2023-01-18 LAB — URINALYSIS, ROUTINE W REFLEX MICROSCOPIC
Bilirubin Urine: NEGATIVE
Glucose, UA: NEGATIVE mg/dL
Hgb urine dipstick: NEGATIVE
Ketones, ur: NEGATIVE mg/dL
Nitrite: NEGATIVE
Protein, ur: 30 mg/dL — AB
Specific Gravity, Urine: 1.035 — ABNORMAL HIGH (ref 1.005–1.030)
WBC, UA: >50 WBC/hpf (ref 0–5)
pH: 8 (ref 5.0–8.0)

## 2023-01-18 LAB — TROPONIN I (HIGH SENSITIVITY)
Troponin I (High Sensitivity): 5 ng/L (ref <18)
Troponin I (High Sensitivity): 5 ng/L (ref <18)

## 2023-01-18 LAB — CBC WITH DIFFERENTIAL/PLATELET
Abs Immature Granulocytes: 0.02 10*3/uL (ref 0.00–0.07)
Basophils Absolute: 0 10*3/uL (ref 0.0–0.1)
Basophils Relative: 1 %
Eosinophils Absolute: 0.3 10*3/uL (ref 0.0–0.5)
Eosinophils Relative: 5 %
HCT: 34.1 % — ABNORMAL LOW (ref 36.0–46.0)
Hemoglobin: 11.4 g/dL — ABNORMAL LOW (ref 12.0–15.0)
Immature Granulocytes: 0 %
Lymphocytes Relative: 39 %
Lymphs Abs: 2.3 10*3/uL (ref 0.7–4.0)
MCH: 29.4 pg (ref 26.0–34.0)
MCHC: 33.4 g/dL (ref 30.0–36.0)
MCV: 87.9 fL (ref 80.0–100.0)
Monocytes Absolute: 0.6 10*3/uL (ref 0.1–1.0)
Monocytes Relative: 11 %
Neutro Abs: 2.7 10*3/uL (ref 1.7–7.7)
Neutrophils Relative %: 44 %
Platelets: 258 10*3/uL (ref 150–400)
RBC: 3.88 MIL/uL (ref 3.87–5.11)
RDW: 13.9 % (ref 11.5–15.5)
WBC: 6 10*3/uL (ref 4.0–10.5)
nRBC: 0 % (ref 0.0–0.2)

## 2023-01-18 LAB — COMPREHENSIVE METABOLIC PANEL
ALT: 26 U/L (ref 0–44)
AST: 35 U/L (ref 15–41)
Albumin: 3.6 g/dL (ref 3.5–5.0)
Alkaline Phosphatase: 46 U/L (ref 38–126)
Anion gap: 10 (ref 5–15)
BUN: 17 mg/dL (ref 8–23)
CO2: 23 mmol/L (ref 22–32)
Calcium: 9.3 mg/dL (ref 8.9–10.3)
Chloride: 101 mmol/L (ref 98–111)
Creatinine, Ser: 0.87 mg/dL (ref 0.44–1.00)
GFR, Estimated: >60 mL/min (ref >60)
Glucose, Bld: 152 mg/dL — ABNORMAL HIGH (ref 70–99)
Potassium: 3.9 mmol/L (ref 3.5–5.1)
Sodium: 134 mmol/L — ABNORMAL LOW (ref 135–145)
Total Bilirubin: 0.8 mg/dL (ref 0.3–1.2)
Total Protein: 6.5 g/dL (ref 6.5–8.1)

## 2023-01-18 LAB — SARS CORONAVIRUS 2 BY RT PCR: SARS Coronavirus 2 by RT PCR: NEGATIVE

## 2023-01-18 MED ORDER — SODIUM CHLORIDE 0.9 % IV BOLUS
1000.0000 mL | Freq: Once | INTRAVENOUS | Status: AC
  Administered 2023-01-18: 1000 mL via INTRAVENOUS

## 2023-01-18 MED ORDER — SODIUM CHLORIDE 0.9 % IV SOLN
1.0000 g | INTRAVENOUS | Status: AC
  Administered 2023-01-18: 1 g via INTRAVENOUS
  Filled 2023-01-18: qty 10

## 2023-01-18 MED ORDER — CEFADROXIL 500 MG PO CAPS: 500.0000 mg | ORAL_CAPSULE | Freq: Two times a day (BID) | ORAL | 0 refills | Status: AC

## 2023-01-18 MED ORDER — IOHEXOL 350 MG/ML SOLN
75.0000 mL | Freq: Once | INTRAVENOUS | Status: AC | PRN
  Administered 2023-01-18: 75 mL via INTRAVENOUS

## 2023-01-18 NOTE — Discharge Instructions (Signed)
Your workup in the Emergency Department today was reassuring generally.  You have a urinary tract infection and are a little bit dehydrated.  We have provided fluids and started you on antibiotics..  We recommend you drink plenty of fluids at home, take your regular medications and/or any new ones prescribed today, and follow up with the doctor(s) listed in these documents as recommended.  Return to the Emergency Department if you develop new or worsening symptoms that concern you.

## 2023-01-18 NOTE — ED Triage Notes (Signed)
Pt comes from home after a fall and become unresponsive. The husband went to pick pt up after fall and pt slipped and fell again. Pt has abrasions on her chin and nose. Pt became responsive when EMS arrived. Pt became unresponsive again and pt's BP dropped to 70/40. EMS gave 500 mL NS through an IV. Pt is responsive, A&Ox4, when she arrived to the ED.

## 2023-01-18 NOTE — ED Provider Notes (Signed)
-----------------------------------------   7:03 AM on 01/18/2023 -----------------------------------------  Assuming care from Dr. Katrinka Blazing.  In short, Evelyn Christian is a 72 y.o. female with a chief complaint of fall and hypotension thought to be due to some dehydration..  Refer to the original H&P for additional details.  The current plan of care is to follow-up on urinalysis and reassess the patient after a trial of ambulation..   Clinical Course as of 01/18/23 1218  Sun Jan 18, 2023  0707 Reassessed.  Husband now at the bedside to provide supplemental history.  Patient reports feeling better and she certainly looks better.  Reports that this has happened a few times in the past and she reports that it was "vasovagal episode" those previous times.  We discussed the possibility of a similar recurrence, but the need for further diagnostics considering her chest discomfort, including CTA chest.  They are agreeable. [DS]  8469 Urinalysis, Routine w reflex microscopic -Urine, Clean Catch(!) Urinalysis consistent with UTI.  I will send a urine culture and begin empiric treatment with ceftriaxone 1 g IV. [CF]  1217 Delayed documentation: Patient felt much better and ambulated without any difficulty.  She and her husband are comfortable with the plan for discharge and outpatient follow-up.  Antibiotics as described above. [CF]    Clinical Course User Index [CF] Loleta Rose, MD [DS] Delton Prairie, MD     Medications  sodium chloride 0.9 % bolus 1,000 mL (0 mLs Intravenous Stopped 01/18/23 0948)  iohexol (OMNIPAQUE) 350 MG/ML injection 75 mL (75 mLs Intravenous Contrast Given 01/18/23 0712)  cefTRIAXone (ROCEPHIN) 1 g in sodium chloride 0.9 % 100 mL IVPB (0 g Intravenous Stopped 01/18/23 1030)     ED Discharge Orders          Ordered    cefadroxil (DURICEF) 500 MG capsule  2 times daily        01/18/23 0920           Final diagnoses:  Fall, initial encounter  Other specified hypotension   Urinary tract infection without hematuria, site unspecified     Loleta Rose, MD 01/18/23 1218

## 2023-01-18 NOTE — ED Notes (Signed)
Went over d/c paperwork at this time with patient. Pt had no questions, comments or concerns after review and verbally understood them.

## 2023-01-18 NOTE — ED Provider Notes (Signed)
[facility] Provider Note    Event Date/Time   First MD Initiated Contact with Patient 01/18/23 [phone]     (approximate)   History   Hypotension, unresponsive , and Fall   HPI  Yexalen Deike is a 72 y.o. female who presents to the ED for evaluation of Hypotension, unresponsive , and Fall   Patient presents to the ED for evaluation of a syncopal episode.  She reports getting up from the bed to get something to drink when she felt dizzy and "I do not know what happened."  Reports that she was feeling chest burning throughout the evening last night  Also reports that she was feeling malaise and generalized weakness in the past 24 hours or so without any focal symptoms until her chest burning last night.  EMS found her at home on the floor poorly responsive with low blood pressures, improving clinical picture and blood pressures with IV fluids.  No reported seizure activity  Physical Exam   Triage Vital Signs: ED Triage Vitals [01/18/23 0554]  Encounter Vitals Group     BP      Systolic BP Percentile      Diastolic BP Percentile      Pulse      Resp      Temp      Temp src      SpO2 100 %     Weight      Height      Head Circumference      Peak Flow      Pain Score      Pain Loc      Pain Education      Exclude from Growth Chart     Most recent vital signs: Vitals:   01/18/23 0610 01/18/23 0630  BP: (!) 102/56 (!) 109/54  Pulse: 60 (!) 56  Resp: 15 13  Temp:    SpO2: 100% 100%    General: Awake, no distress.  CV:  Good peripheral perfusion.  Resp:  Normal effort.  Abd:  No distention.  MSK:  No deformity noted.  Neuro:  No focal deficits appreciated. Cranial nerves II through XII intact 5/5 strength and sensation in all 4 extremities Other:     ED Results / Procedures / Treatments   Labs (all labs ordered are listed, but only abnormal results are displayed) Labs Reviewed  COMPREHENSIVE METABOLIC PANEL - Abnormal; Notable  for the following components:      Result Value   Sodium 134 (*)    Glucose, Bld 152 (*)    All other components within normal limits  CBC WITH DIFFERENTIAL/PLATELET - Abnormal; Notable for the following components:   Hemoglobin 11.4 (*)    HCT 34.1 (*)    All other components within normal limits  SARS CORONAVIRUS 2 BY RT PCR  URINALYSIS, ROUTINE W REFLEX MICROSCOPIC  TROPONIN I (HIGH SENSITIVITY)    EKG Sinus rhythm with a rate of 56 bpm.  Normal axis and intervals.  No clear signs of acute ischemia.  RADIOLOGY   Official radiology report(s): No results found.  PROCEDURES and INTERVENTIONS:  .1-3 Lead EKG Interpretation  Performed by: Delton Prairie, MD Authorized by: Delton Prairie, MD     Interpretation: normal     ECG rate:  60   ECG rate assessment: normal     Rhythm: sinus rhythm     Ectopy: none     Conduction: normal     Medications  iohexol (OMNIPAQUE) 350  MG/ML injection 75 mL (has no administration in time range)  sodium chloride 0.9 % bolus 1,000 mL (1,000 mLs Intravenous New Bag/Given 01/18/23 0611)     IMPRESSION / MDM / ASSESSMENT AND PLAN / ED COURSE  I reviewed the triage vital signs and the nursing notes.  Differential diagnosis includes, but is not limited to, vasovagal episode, hypotension, symptomatic anemia, seizure, stroke, cardiac dysrhythmia, PE, viral syndrome  {Patient presents with symptoms of an acute illness or injury that is potentially life-threatening.  Patient presents after a fall and possible syncope at home in the setting of presyncopal dizziness after getting up from bed.  She initially is hypotensive, resolving by the time she arrived to the ED and has a reassuring examination in the ED.  No evidence of neurologic deficits.  Some abrasions on her nose but no other or significant signs of trauma.  EKG is reassuring as well as her basic labs.  Normal WBC, metabolic panel and troponin.  Awaiting CTs the time of signout.   She has a  history of vasovagal episodes and this is possibly a recurrence or orthostasis.  Clinical Course as of 01/18/23 0708  Wynelle Link Jan 18, 2023  0707 Reassessed.  Husband now at the bedside to provide supplemental history.  Patient reports feeling better and she certainly looks better.  Reports that this has happened a few times in the past and she reports that it was "vasovagal episode" those previous times.  We discussed the possibility of a similar recurrence, but the need for further diagnostics considering her chest discomfort, including CTA chest.  They are agreeable. [DS]    Clinical Course User Index [DS] Delton Prairie, MD     FINAL CLINICAL IMPRESSION(S) / ED DIAGNOSES   Final diagnoses:  Fall, initial encounter  Other specified hypotension     Rx / DC Orders   ED Discharge Orders     None        Note:  This document was prepared using Dragon voice recognition software and may include unintentional dictation errors.   Delton Prairie, MD 01/18/23 [phone]

## 2023-01-18 NOTE — ED Notes (Signed)
Ambulation trial was successfully completed at this time. Pt denied any dizziness, lightheadedness or blurry vision throughout the trial. Pt's HR maintained in the 80's and O2 stats was 100% throughout the trial.

## 2023-01-19 ENCOUNTER — Emergency Department: Payer: Medicare Other

## 2023-01-19 ENCOUNTER — Other Ambulatory Visit: Payer: Self-pay

## 2023-01-19 ENCOUNTER — Emergency Department
Admission: EM | Admit: 2023-01-19 | Discharge: 2023-01-19 | Disposition: A | Discharge status: Home / Self Care | Payer: Medicare Other | Attending: Emergency Medicine | Admitting: Emergency Medicine

## 2023-01-19 ENCOUNTER — Encounter: Payer: Self-pay | Admitting: Emergency Medicine

## 2023-01-19 DIAGNOSIS — I10 Essential (primary) hypertension: Secondary | ICD-10-CM | POA: Diagnosis present

## 2023-01-19 DIAGNOSIS — E039 Hypothyroidism, unspecified: Secondary | ICD-10-CM | POA: Diagnosis not present

## 2023-01-19 DIAGNOSIS — I160 Hypertensive urgency: Secondary | ICD-10-CM | POA: Diagnosis not present

## 2023-01-19 LAB — BASIC METABOLIC PANEL
Anion gap: 12 (ref 5–15)
BUN: 13 mg/dL (ref 8–23)
CO2: 23 mmol/L (ref 22–32)
Calcium: 9.3 mg/dL (ref 8.9–10.3)
Chloride: 99 mmol/L (ref 98–111)
Creatinine, Ser: 0.68 mg/dL (ref 0.44–1.00)
GFR, Estimated: >60 mL/min (ref >60)
Glucose, Bld: 96 mg/dL (ref 70–99)
Potassium: 3.8 mmol/L (ref 3.5–5.1)
Sodium: 134 mmol/L — ABNORMAL LOW (ref 135–145)

## 2023-01-19 LAB — CBC
HCT: 37.2 % (ref 36.0–46.0)
Hemoglobin: 12.4 g/dL (ref 12.0–15.0)
MCH: 29.2 pg (ref 26.0–34.0)
MCHC: 33.3 g/dL (ref 30.0–36.0)
MCV: 87.7 fL (ref 80.0–100.0)
Platelets: 283 10*3/uL (ref 150–400)
RBC: 4.24 MIL/uL (ref 3.87–5.11)
RDW: 13.9 % (ref 11.5–15.5)
WBC: 5.7 10*3/uL (ref 4.0–10.5)
nRBC: 0 % (ref 0.0–0.2)

## 2023-01-19 LAB — TROPONIN I (HIGH SENSITIVITY)
Troponin I (High Sensitivity): 4 ng/L (ref <18)
Troponin I (High Sensitivity): 5 ng/L (ref <18)

## 2023-01-19 MED ORDER — AMLODIPINE BESYLATE 2.5 MG PO TABS: 2.5000 mg | ORAL_TABLET | Freq: Every day | ORAL | 0 refills | Status: AC

## 2023-01-19 MED ORDER — AMLODIPINE BESYLATE 5 MG PO TABS: 5.0000 mg | ORAL_TABLET | Freq: Once | ORAL | Status: DC

## 2023-01-19 MED ORDER — AMLODIPINE BESYLATE 5 MG PO TABS
2.5000 mg | ORAL_TABLET | Freq: Once | ORAL | Status: AC
  Administered 2023-01-19: 2.5 mg via ORAL
  Filled 2023-01-19: qty 1

## 2023-01-19 NOTE — ED Triage Notes (Signed)
Patient to ED via POV for hypertension. Patient states she was seen here yesterday for syncope and hypotension. Today BP in the 200's at home. No history of hypertension. Patient states she has pressure in her chest at this time. Dx with UTI yesterday- taking antibiotic for same.

## 2023-01-19 NOTE — ED Provider Notes (Signed)
[facility] Provider Note    Event Date/Time   First MD Initiated Contact with Patient 01/19/23 1614     (approximate)   History   Hypertension   HPI  Evelyn Christian is a 72 y.o. female history of ankylosing spondylitis hypothyroidism  Yesterday patient had episode where she passed out and was diagnosed with a UTI.  She received IV fluid.  Today she reports that she checked her blood pressure through the nurse that her complex and it was quite high.  Is size that it ever been.  She does not carry a history of hypertension.  She is currently on antibiotic for a UTI.  She felt a slight sense of chest pressure earlier that is gone away.  She denies any ongoing symptoms no headache no nausea no vomiting.  No further episodes of passing out.  No numbness tingling or weakness or other concerns  She has never had high blood pressure before.  She worked as a Scientist, clinical (histocompatibility and immunogenetics) and is very particular about her medications     Physical Exam   Triage Vital Signs: ED Triage Vitals  Encounter Vitals Group     BP 01/19/23 1306 (!) 185/86     Systolic BP Percentile --      Diastolic BP Percentile --      Pulse Rate 01/19/23 1306 63     Resp 01/19/23 1306 18     Temp 01/19/23 1306 98 F (36.7 C)     Temp Source 01/19/23 1306 Oral     SpO2 01/19/23 1306 100 %     Weight 01/19/23 1307 147 lb (66.7 kg)     Height 01/19/23 1307 5' (1.524 m)     Head Circumference --      Peak Flow --      Pain Score 01/19/23 1306 3     Pain Loc --      Pain Education --      Exclude from Growth Chart --     Most recent vital signs: Vitals:   01/19/23 1810 01/19/23 1835  BP: (!) 191/63 (!) 173/66  Pulse:  60  Resp:    Temp:    SpO2:  100%     General: Awake, no distress.  She and her husband are both very pleasant CV:  Good peripheral perfusion.  Normal tones and rate Resp:  Normal effort.  Clear bilateral abd:  No distention.  Soft nontender  nondistended Other:  No lower extremity edema She is awake alert well oriented.  Speech is clear.  Moves all extremities without deficit.  No facial droop.  Denies any symptoms at this time.  No vision changes no headache no ongoing chest pressure or other symptoms   ED Results / Procedures / Treatments   Labs (all labs ordered are listed, but only abnormal results are displayed) Labs Reviewed  BASIC METABOLIC PANEL - Abnormal; Notable for the following components:      Result Value   Sodium 134 (*)    All other components within normal limits  CBC  TROPONIN I (HIGH SENSITIVITY)  TROPONIN I (HIGH SENSITIVITY)   Labs interpreted as normal CBC normal metabolic panel.  2 normal troponins.  Chest x-ray interpreted by me as negative for acute finding  EKG  ED ECG REPORT I, Sharyn Creamer, the attending physician, personally viewed and interpreted this ECG.  Date: 01/19/2023 EKG Time: 1315 Rate: 60 Rhythm: normal sinus rhythm QRS Axis: normal Intervals: normal ST/T Wave abnormalities: normal  Narrative Interpretation: no evidence of acute ischemia   PROCEDURES:  Critical Care performed: No  Procedures   MEDICATIONS ORDERED IN ED: Medications  amLODipine (NORVASC) tablet 2.5 mg (2.5 mg Oral Given 01/19/23 1636)     IMPRESSION / MDM / ASSESSMENT AND PLAN / ED COURSE  I reviewed the triage vital signs and the nursing notes.                              Differential diagnosis includes, but is not limited to, hypertensive urgency, AKI, volume overload, metabolic abnormality, etc.  I rechecked her blood pressure on both arms with blood pressures equivalent bilaterally.  She has no back pain no chest pain no neurologic symptoms no cardiac symptoms.  She is awake alert well-oriented.  In the context of her having passed out diagnosed a UTI yesterday I question if perhaps the fluid volume she received could be contributing  Patient's presentation is most consistent with acute  complicated illness / injury requiring diagnostic workup.    After receiving dose of amlodipine, and observation patient's blood pressure slowly improving.  Patient comfortable with plan for discharge.  Currently asymptomatic.  Will be following up tomorrow with her primary care  Return precautions and treatment recommendations and follow-up discussed with the patient who is agreeable with the plan.       FINAL CLINICAL IMPRESSION(S) / ED DIAGNOSES   Final diagnoses:  Hypertensive urgency     Rx / DC Orders   ED Discharge Orders          Ordered    amLODipine (NORVASC) 2.5 MG tablet  Daily        01/19/23 1830             Note:  This document was prepared using Dragon voice recognition software and may include unintentional dictation errors.   Sharyn Creamer, MD 01/22/23 1045

## 2023-01-23 ENCOUNTER — Other Ambulatory Visit: Payer: Self-pay | Admitting: Internal Medicine

## 2023-01-23 DIAGNOSIS — R0789 Other chest pain: Secondary | ICD-10-CM

## 2023-01-23 DIAGNOSIS — I2510 Atherosclerotic heart disease of native coronary artery without angina pectoris: Secondary | ICD-10-CM

## 2023-01-23 DIAGNOSIS — R55 Syncope and collapse: Secondary | ICD-10-CM

## 2023-01-27 ENCOUNTER — Ambulatory Visit
Admission: RE | Admit: 2023-01-27 | Discharge: 2023-01-27 | Disposition: A | Discharge status: Home / Self Care | Payer: BLUE CROSS/BLUE SHIELD | Source: Ambulatory Visit | Attending: Internal Medicine | Admitting: Internal Medicine

## 2023-01-27 DIAGNOSIS — R55 Syncope and collapse: Secondary | ICD-10-CM | POA: Insufficient documentation

## 2023-01-27 DIAGNOSIS — I2510 Atherosclerotic heart disease of native coronary artery without angina pectoris: Secondary | ICD-10-CM | POA: Insufficient documentation

## 2023-01-27 DIAGNOSIS — R0789 Other chest pain: Secondary | ICD-10-CM | POA: Insufficient documentation

## 2023-03-03 ENCOUNTER — Other Ambulatory Visit: Payer: Self-pay | Admitting: Internal Medicine

## 2023-03-03 DIAGNOSIS — Z1231 Encounter for screening mammogram for malignant neoplasm of breast: Secondary | ICD-10-CM

## 2023-04-02 ENCOUNTER — Ambulatory Visit
Admission: RE | Admit: 2023-04-02 | Discharge: 2023-04-02 | Disposition: A | Discharge status: Home / Self Care | Payer: Medicare Other | Source: Ambulatory Visit | Attending: Internal Medicine | Admitting: Internal Medicine

## 2023-04-02 DIAGNOSIS — Z1231 Encounter for screening mammogram for malignant neoplasm of breast: Secondary | ICD-10-CM | POA: Diagnosis present

## 2023-05-14 ENCOUNTER — Other Ambulatory Visit: Payer: Self-pay | Admitting: Medical Genetics

## 2023-05-18 ENCOUNTER — Other Ambulatory Visit
Admission: RE | Admit: 2023-05-18 | Discharge: 2023-05-18 | Disposition: A | Discharge status: Home / Self Care | Payer: Self-pay | Source: Ambulatory Visit | Attending: Medical Genetics | Admitting: Medical Genetics

## 2023-05-20 ENCOUNTER — Other Ambulatory Visit: Payer: Self-pay | Admitting: Medical Genetics

## 2023-06-02 LAB — GENECONNECT MOLECULAR SCREEN: Genetic Analysis Overall Interpretation: NEGATIVE

## 2023-06-12 ENCOUNTER — Ambulatory Visit: Payer: Medicare Other | Admitting: Urology

## 2023-06-18 ENCOUNTER — Ambulatory Visit: Payer: Medicare Other | Admitting: Urology

## 2024-03-02 ENCOUNTER — Other Ambulatory Visit: Payer: Self-pay | Admitting: Internal Medicine

## 2024-03-02 DIAGNOSIS — Z1231 Encounter for screening mammogram for malignant neoplasm of breast: Secondary | ICD-10-CM

## 2024-04-12 ENCOUNTER — Ambulatory Visit
Admission: RE | Admit: 2024-04-12 | Discharge: 2024-04-12 | Disposition: A | Discharge status: Home / Self Care | Source: Ambulatory Visit | Attending: Internal Medicine | Admitting: Internal Medicine

## 2024-04-12 DIAGNOSIS — Z1231 Encounter for screening mammogram for malignant neoplasm of breast: Secondary | ICD-10-CM | POA: Insufficient documentation
